# Patient Record
Sex: Male | Born: 1975 | Race: Black or African American | Hispanic: No | Marital: Single | Smoking: Current every day smoker
Health system: Southern US, Community
[De-identification: ages and names within clinical notes are randomized; demographics above are authoritative.]

---

## 2014-09-17 ENCOUNTER — Emergency Department (INDEPENDENT_AMBULATORY_CARE_PROVIDER_SITE_OTHER)
Admission: EM | Admit: 2014-09-17 | Discharge: 2014-09-17 | Disposition: A | Discharge status: Home / Self Care | Payer: BLUE CROSS/BLUE SHIELD | Source: Home / Self Care | Attending: Family Medicine | Admitting: Family Medicine

## 2014-09-17 ENCOUNTER — Encounter (HOSPITAL_COMMUNITY): Payer: Self-pay | Admitting: Emergency Medicine

## 2014-09-17 DIAGNOSIS — T148XXA Other injury of unspecified body region, initial encounter: Secondary | ICD-10-CM

## 2014-09-17 DIAGNOSIS — S39012A Strain of muscle, fascia and tendon of lower back, initial encounter: Secondary | ICD-10-CM

## 2014-09-17 DIAGNOSIS — T148 Other injury of unspecified body region: Secondary | ICD-10-CM | POA: Diagnosis not present

## 2014-09-17 MED ORDER — NAPROXEN 375 MG PO TABS: 375.0000 mg | ORAL_TABLET | Freq: Two times a day (BID) | ORAL | Status: AC

## 2014-09-17 MED ORDER — DICLOFENAC SODIUM 1 % TD GEL
1.0000 | Freq: Four times a day (QID) | TRANSDERMAL | Status: AC
Start: 2014-09-17 — End: ?

## 2014-09-17 NOTE — ED Provider Notes (Signed)
CSN: [id number]     Arrival date & time 09/17/14  1925 History   First MD Initiated Contact with Patient 09/17/14 2041     Chief Complaint  Patient presents with  . Back Pain   (Consider location/radiation/quality/duration/timing/severity/associated sxs/prior Treatment) HPI Comments: 39 year old man was lifting a mattress yesterday evening and experienced sharp pain in the right low back. Pain is gotten worse over the past several hours. It is worse with bending, and getting up and down from a chair and other movements. Pain does not radiate across to the left. Denies spinal pain. Denies focal paresthesias or weakness.   History reviewed. No pertinent past medical history. History reviewed. No pertinent past surgical history. No family history on file. History  Substance Use Topics  . Smoking status: Current Every Day Smoker -- 0.50 packs/day    Types: Cigarettes  . Smokeless tobacco: Not on file  . Alcohol Use: No    Review of Systems  Constitutional: Negative.   Respiratory: Negative.   Gastrointestinal: Negative.   Musculoskeletal: Positive for back pain.  Neurological: Negative.  Negative for numbness.  Psychiatric/Behavioral: Negative.   All other systems reviewed and are negative.   Allergies  Review of patient's allergies indicates no known allergies.  Home Medications   Prior to Admission medications   Medication Sig Start Date End Date Taking? Authorizing Provider  diclofenac sodium (VOLTAREN) 1 % GEL Apply 1 application topically 4 (four) times daily. 09/17/14   Hayden Rasmussenavid Gavyn Zoss, NP  naproxen (NAPROSYN) 375 MG tablet Take 1 tablet (375 mg total) by mouth 2 (two) times daily. 09/17/14   Hayden Rasmussenavid Leeandre Nordling, NP   BP 124/85 mmHg  Pulse 67  Temp(Src) 98.1 F (36.7 C) (Oral)  Resp 18  SpO2 97% Physical Exam  Constitutional: He is oriented to person, place, and time. He appears well-developed and well-nourished.  HENT:  Head: Normocephalic and atraumatic.  Eyes: EOM are normal.  Left eye exhibits no discharge.  Neck: Normal range of motion. Neck supple.  Pulmonary/Chest: Effort normal. No respiratory distress.  Musculoskeletal:  Tenderness to the right low back. Lumbar musculature. No spinal tenderness or deformity. Distal neurovascular motor Sentry is intact.  Neurological: He is alert and oriented to person, place, and time. No cranial nerve deficit.  Skin: Skin is warm and dry.  Psychiatric: He has a normal mood and affect.  Nursing note and vitals reviewed.   ED Course  Procedures (including critical care time) Labs Review Labs Reviewed - No data to display  Imaging Review No results found.   MDM   1. Lumbar strain, initial encounter   2. Muscle strain    Diclofenac gel 4 times a day, HEENT pads or thermic care wrap frequently Naprosyn twice a day when necessary No heavy lifting bending or twisting. Perform stretches gradually as directed.    Hayden Rasmussenavid Gianina Olinde, NP 09/17/14 2053

## 2014-09-17 NOTE — ED Notes (Signed)
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

## 2014-09-17 NOTE — Discharge Instructions (Signed)
Back Pain, Adult Low back pain is very common. About 1 in 5 people have back pain.The cause of low back pain is rarely dangerous. The pain often gets better over time.About half of people with a sudden onset of back pain feel better in just 2 weeks. About 8 in 10 people feel better by 6 weeks.  CAUSES Some common causes of back pain include:  Strain of the muscles or ligaments supporting the spine.  Wear and tear (degeneration) of the spinal discs.  Arthritis.  Direct injury to the back. DIAGNOSIS Most of the time, the direct cause of low back pain is not known.However, back pain can be treated effectively even when the exact cause of the pain is unknown.Answering your caregiver's questions about your overall health and symptoms is one of the most accurate ways to make sure the cause of your pain is not dangerous. If your caregiver needs more information, he or she may order lab work or imaging tests (X-rays or MRIs).However, even if imaging tests show changes in your back, this usually does not require surgery. HOME CARE INSTRUCTIONS For many people, back pain returns.Since low back pain is rarely dangerous, it is often a condition that people can learn to [facility] their own.   Remain active. It is stressful on the back to sit or stand in one place. Do not sit, drive, or stand in one place for more than 30 minutes at a time. Take short walks on level surfaces as soon as pain allows.Try to increase the length of time you walk each day.  Do not stay in bed.Resting more than 1 or 2 days can delay your recovery.  Do not avoid exercise or work.Your body is made to move.It is not dangerous to be active, even though your back may hurt.Your back will likely heal faster if you return to being active before your pain is gone.  Pay attention to your body when you bend and lift. Many people have less discomfortwhen lifting if they bend their knees, keep the load close to their bodies,and  avoid twisting. Often, the most comfortable positions are those that put less stress on your recovering back.  Find a comfortable position to sleep. Use a firm mattress and lie on your side with your knees slightly bent. If you lie on your back, put a pillow under your knees.  Only take over-the-counter or prescription medicines as directed by your caregiver. Over-the-counter medicines to reduce pain and inflammation are often the most helpful.Your caregiver may prescribe muscle relaxant drugs.These medicines help dull your pain so you can more quickly return to your normal activities and healthy exercise.  Put ice on the injured area.  Put ice in a plastic bag.  Place a towel between your skin and the bag.  Leave the ice on for 15-20 minutes, 03-04 times a day for the first 2 to 3 days. After that, ice and heat may be alternated to reduce pain and spasms.  Ask your caregiver about trying back exercises and gentle massage. This may be of some benefit.  Avoid feeling anxious or stressed.Stress increases muscle tension and can worsen back pain.It is important to recognize when you are anxious or stressed and learn ways to manage it.Exercise is a great option. SEEK MEDICAL CARE IF:  You have pain that is not relieved with rest or medicine.  You have pain that does not improve in 1 week.  You have new symptoms.  You are generally not feeling well. SEEK  IMMEDIATE MEDICAL CARE IF:   You have pain that radiates from your back into your legs.  You develop new bowel or bladder control problems.  You have unusual weakness or numbness in your arms or legs.  You develop nausea or vomiting.  You develop abdominal pain.  You feel faint. Document Released: 05/07/2005 Document Revised: 11/06/2011 Document Reviewed: 09/08/2013 [facility] Patient Information 2015 [place], [place]. This information is not intended to replace advice given to you by your health care provider. Make sure you  discuss any questions you have with your health care provider.  Low Back Strain with Rehab A strain is an injury in which a tendon or muscle is torn. The muscles and tendons of the lower back are vulnerable to strains. However, these muscles and tendons are very strong and require a great force to be injured. Strains are classified into three categories. Grade 1 strains cause pain, but the tendon is not lengthened. Grade 2 strains include a lengthened ligament, due to the ligament being stretched or partially ruptured. With grade 2 strains there is still function, although the function may be decreased. Grade 3 strains involve a complete tear of the tendon or muscle, and function is usually impaired. SYMPTOMS   Pain in the lower back.  Pain that affects one side more than the other.  Pain that gets worse with movement and may be felt in the hip, buttocks, or back of the thigh.  Muscle spasms of the muscles in the back.  Swelling along the muscles of the back.  Loss of strength of the back muscles.  Crackling sound (crepitation) when the muscles are touched. CAUSES  Lower back strains occur when a force is placed on the muscles or tendons that is greater than they can handle. Common causes of injury include:  Prolonged overuse of the muscle-tendon units in the lower back, usually from incorrect posture.  A single violent injury or force applied to the back. RISK INCREASES WITH:  Sports that involve twisting forces on the spine or a lot of bending at the waist (football, rugby, weightlifting, bowling, golf, tennis, speed skating, racquetball, swimming, running, gymnastics, diving).  Poor strength and flexibility.  Failure to warm up properly before activity.  Family history of lower back pain or disk disorders.  Previous back injury or surgery (especially fusion).  Poor posture with lifting, especially heavy objects.  Prolonged sitting, especially with poor posture. PREVENTION     Learn and use proper posture when sitting or lifting (maintain proper posture when sitting, lift using the knees and legs, not at the waist).  Warm up and stretch properly before activity.  Allow for adequate recovery between workouts.  Maintain physical fitness:  Strength, flexibility, and endurance.  Cardiovascular fitness. PROGNOSIS  If treated properly, lower back strains usually heal within 6 weeks. RELATED COMPLICATIONS   Recurring symptoms, resulting in a chronic problem.  Chronic inflammation, scarring, and partial muscle-tendon tear.  Delayed healing or resolution of symptoms.  Prolonged disability. TREATMENT  Treatment first involves the use of ice and medicine, to reduce pain and inflammation. The use of strengthening and stretching exercises may help reduce pain with activity. These exercises may be performed at home or with a therapist. Severe injuries may require referral to a therapist for further evaluation and treatment, such as ultrasound. Your caregiver may advise that you wear a back brace or corset, to help reduce pain and discomfort. Often, prolonged bed rest results in greater harm then benefit. Corticosteroid injections may be  recommended. However, these should be reserved for the most serious cases. It is important to avoid using your back when lifting objects. At night, sleep on your back on a firm mattress with a pillow placed under your knees. If non-surgical treatment is unsuccessful, surgery may be needed.  MEDICATION   If pain medicine is needed, nonsteroidal anti-inflammatory medicines (aspirin and ibuprofen), or other minor pain relievers (acetaminophen), are often advised.  Do not take pain medicine for 7 days before surgery.  Prescription pain relievers may be given, if your caregiver thinks they are needed. Use only as directed and only as much as you need.  Ointments applied to the skin may be helpful.  Corticosteroid injections may be given  by your caregiver. These injections should be reserved for the most serious cases, because they may only be given a certain number of times. HEAT AND COLD  Cold treatment (icing) should be applied for 10 to 15 minutes every 2 to 3 hours for inflammation and pain, and immediately after activity that aggravates your symptoms. Use ice packs or an ice massage.  Heat treatment may be used before performing stretching and strengthening activities prescribed by your caregiver, physical therapist, or athletic trainer. Use a heat pack or a warm water soak. SEEK MEDICAL CARE IF:   Symptoms get worse or do not improve in 2 to 4 weeks, despite treatment.  You develop numbness, weakness, or loss of bowel or bladder function.  New, unexplained symptoms develop. (Drugs used in treatment may produce side effects.) EXERCISES  RANGE OF MOTION (ROM) AND STRETCHING EXERCISES - Low Back Strain Most people with lower back pain will find that their symptoms get worse with excessive bending forward (flexion) or arching at the lower back (extension). The exercises which will help resolve your symptoms will focus on the opposite motion.  Your physician, physical therapist or athletic trainer will help you determine which exercises will be most helpful to resolve your lower back pain. Do not complete any exercises without first consulting with your caregiver. Discontinue any exercises which make your symptoms worse until you speak to your caregiver.  If you have pain, numbness or tingling which travels down into your buttocks, leg or foot, the goal of the therapy is for these symptoms to move closer to your back and eventually resolve. Sometimes, these leg symptoms will get better, but your lower back pain may worsen. This is typically an indication of progress in your rehabilitation. Be very alert to any changes in your symptoms and the activities in which you participated in the 24 hours prior to the change. Sharing this  information with your caregiver will allow him/her to most efficiently treat your condition.  These exercises may help you when beginning to rehabilitate your injury. Your symptoms may resolve with or without further involvement from your physician, physical therapist or athletic trainer. While completing these exercises, remember:  Restoring tissue flexibility helps normal motion to return to the joints. This allows healthier, less painful movement and activity.  An effective stretch should be held for at least 30 seconds.  A stretch should never be painful. You should only feel a gentle lengthening or release in the stretched tissue. FLEXION RANGE OF MOTION AND STRETCHING EXERCISES: STRETCH - Flexion, Single Knee to Chest   Lie on a firm bed or floor with both legs extended in front of you.  Keeping one leg in contact with the floor, bring your opposite knee to your chest. Hold your leg in place  by either grabbing behind your thigh or at your knee.  Pull until you feel a gentle stretch in your lower back. Hold __________ seconds.  Slowly release your grasp and repeat the exercise with the opposite side. Repeat __________ times. Complete this exercise __________ times per day.  STRETCH - Flexion, Double Knee to Chest   Lie on a firm bed or floor with both legs extended in front of you.  Keeping one leg in contact with the floor, bring your opposite knee to your chest.  Tense your stomach muscles to support your back and then lift your other knee to your chest. Hold your legs in place by either grabbing behind your thighs or at your knees.  Pull both knees toward your chest until you feel a gentle stretch in your lower back. Hold __________ seconds.  Tense your stomach muscles and slowly return one leg at a time to the floor. Repeat __________ times. Complete this exercise __________ times per day.  STRETCH - Low Trunk Rotation  Lie on a firm bed or floor. Keeping your legs in front  of you, bend your knees so they are both pointed toward the ceiling and your feet are flat on the floor.  Extend your arms out to the side. This will stabilize your upper body by keeping your shoulders in contact with the floor.  Gently and slowly drop both knees together to one side until you feel a gentle stretch in your lower back. Hold for __________ seconds.  Tense your stomach muscles to support your lower back as you bring your knees back to the starting position. Repeat the exercise to the other side. Repeat __________ times. Complete this exercise __________ times per day  EXTENSION RANGE OF MOTION AND FLEXIBILITY EXERCISES: STRETCH - Extension, Prone on Elbows   Lie on your stomach on the floor, a bed will be too soft. Place your palms about shoulder width apart and at the height of your head.  Place your elbows under your shoulders. If this is too painful, stack pillows under your chest.  Allow your body to relax so that your hips drop lower and make contact more completely with the floor.  Hold this position for __________ seconds.  Slowly return to lying flat on the floor. Repeat __________ times. Complete this exercise __________ times per day.  RANGE OF MOTION - Extension, Prone Press Ups  Lie on your stomach on the floor, a bed will be too soft. Place your palms about shoulder width apart and at the height of your head.  Keeping your back as relaxed as possible, slowly straighten your elbows while keeping your hips on the floor. You may adjust the placement of your hands to maximize your comfort. As you gain motion, your hands will come more underneath your shoulders.  Hold this position __________ seconds.  Slowly return to lying flat on the floor. Repeat __________ times. Complete this exercise __________ times per day.  RANGE OF MOTION- Quadruped, Neutral Spine   Assume a hands and knees position on a firm surface. Keep your hands under your shoulders and your  knees under your hips. You may place padding under your knees for comfort.  Drop your head and point your tail bone toward the ground below you. This will round out your lower back like an angry cat. Hold this position for __________ seconds.  Slowly lift your head and release your tail bone so that your back sags into a large arch, like an old horse.  Hold this position for __________ seconds.  Repeat this until you feel limber in your lower back.  Now, find your "sweet spot." This will be the most comfortable position somewhere between the two previous positions. This is your neutral spine. Once you have found this position, tense your stomach muscles to support your lower back.  Hold this position for __________ seconds. Repeat __________ times. Complete this exercise __________ times per day.  STRENGTHENING EXERCISES - Low Back Strain These exercises may help you when beginning to rehabilitate your injury. These exercises should be done near your "sweet spot." This is the neutral, low-back arch, somewhere between fully rounded and fully arched, that is your least painful position. When performed in this safe range of motion, these exercises can be used for people who have either a flexion or extension based injury. These exercises may resolve your symptoms with or without further involvement from your physician, physical therapist or athletic trainer. While completing these exercises, remember:   Muscles can gain both the endurance and the strength needed for everyday activities through controlled exercises.  Complete these exercises as instructed by your physician, physical therapist or athletic trainer. Increase the resistance and repetitions only as guided.  You may experience muscle soreness or fatigue, but the pain or discomfort you are trying to eliminate should never worsen during these exercises. If this pain does worsen, stop and make certain you are following the directions  exactly. If the pain is still present after adjustments, discontinue the exercise until you can discuss the trouble with your caregiver. STRENGTHENING - Deep Abdominals, Pelvic Tilt  Lie on a firm bed or floor. Keeping your legs in front of you, bend your knees so they are both pointed toward the ceiling and your feet are flat on the floor.  Tense your lower abdominal muscles to press your lower back into the floor. This motion will rotate your pelvis so that your tail bone is scooping upwards rather than pointing at your feet or into the floor.  With a gentle tension and even breathing, hold this position for __________ seconds. Repeat __________ times. Complete this exercise __________ times per day.  STRENGTHENING - Abdominals, Crunches   Lie on a firm bed or floor. Keeping your legs in front of you, bend your knees so they are both pointed toward the ceiling and your feet are flat on the floor. Cross your arms over your chest.  Slightly tip your chin down without bending your neck.  Tense your abdominals and slowly lift your trunk high enough to just clear your shoulder blades. Lifting higher can put excessive stress on the lower back and does not further strengthen your abdominal muscles.  Control your return to the starting position. Repeat __________ times. Complete this exercise __________ times per day.  STRENGTHENING - Quadruped, Opposite UE/LE Lift   Assume a hands and knees position on a firm surface. Keep your hands under your shoulders and your knees under your hips. You may place padding under your knees for comfort.  Find your neutral spine and gently tense your abdominal muscles so that you can maintain this position. Your shoulders and hips should form a rectangle that is parallel with the floor and is not twisted.  Keeping your trunk steady, lift your right hand no higher than your shoulder and then your left leg no higher than your hip. Make sure you are not holding your  breath. Hold this position __________ seconds.  Continuing to keep your abdominal muscles tense  and your back steady, slowly return to your starting position. Repeat with the opposite arm and leg. Repeat __________ times. Complete this exercise __________ times per day.  STRENGTHENING - Lower Abdominals, Double Knee Lift  Lie on a firm bed or floor. Keeping your legs in front of you, bend your knees so they are both pointed toward the ceiling and your feet are flat on the floor.  Tense your abdominal muscles to brace your lower back and slowly lift both of your knees until they come over your hips. Be certain not to hold your breath.  Hold __________ seconds. Using your abdominal muscles, return to the starting position in a slow and controlled manner. Repeat __________ times. Complete this exercise __________ times per day.  POSTURE AND BODY MECHANICS CONSIDERATIONS - Low Back Strain Keeping correct posture when sitting, standing or completing your activities will reduce the stress put on different body tissues, allowing injured tissues a chance to heal and limiting painful experiences. The following are general guidelines for improved posture. Your physician or physical therapist will provide you with any instructions specific to your needs. While reading these guidelines, remember:  The exercises prescribed by your provider will help you have the flexibility and strength to maintain correct postures.  The correct posture provides the best environment for your joints to work. All of your joints have less wear and tear when properly supported by a spine with good posture. This means you will experience a healthier, less painful body.  Correct posture must be practiced with all of your activities, especially prolonged sitting and standing. Correct posture is as important when doing repetitive low-stress activities (typing) as it is when doing a single heavy-load activity (lifting). RESTING  POSITIONS Consider which positions are most painful for you when choosing a resting position. If you have pain with flexion-based activities (sitting, bending, stooping, squatting), choose a position that allows you to rest in a less flexed posture. You would want to avoid curling into a fetal position on your side. If your pain worsens with extension-based activities (prolonged standing, working overhead), avoid resting in an extended position such as sleeping on your stomach. Most people will find more comfort when they rest with their spine in a more neutral position, neither too rounded nor too arched. Lying on a non-sagging bed on your side with a pillow between your knees, or on your back with a pillow under your knees will often provide some relief. Keep in mind, being in any one position for a prolonged period of time, no matter how correct your posture, can still lead to stiffness. PROPER SITTING POSTURE In order to minimize stress and discomfort on your spine, you must sit with correct posture. Sitting with good posture should be effortless for a healthy body. Returning to good posture is a gradual process. Many people can work toward this most comfortably by using various supports until they have the flexibility and strength to maintain this posture on their own. When sitting with proper posture, your ears will fall over your shoulders and your shoulders will fall over your hips. You should use the back of the chair to support your upper back. Your lower back will be in a neutral position, just slightly arched. You may place a small pillow or folded towel at the base of your lower back for support.  When working at a desk, create an environment that supports good, upright posture. Without extra support, muscles tire, which leads to excessive strain on joints and  other tissues. Keep these recommendations in mind: CHAIR:  A chair should be able to slide under your desk when your back makes contact  with the back of the chair. This allows you to work closely.  The chair's height should allow your eyes to be level with the upper part of your monitor and your hands to be slightly lower than your elbows. BODY POSITION  Your feet should make contact with the floor. If this is not possible, use a foot rest.  Keep your ears over your shoulders. This will reduce stress on your neck and lower back. INCORRECT SITTING POSTURES  If you are feeling tired and unable to assume a healthy sitting posture, do not slouch or slump. This puts excessive strain on your back tissues, causing more damage and pain. Healthier options include:  Using more support, like a lumbar pillow.  Switching tasks to something that requires you to be upright or walking.  Talking a brief walk.  Lying down to rest in a neutral-spine position. PROLONGED STANDING WHILE SLIGHTLY LEANING FORWARD  When completing a task that requires you to lean forward while standing in one place for a long time, place either foot up on a stationary 2-4 inch high object to help maintain the best posture. When both feet are on the ground, the lower back tends to lose its slight inward curve. If this curve flattens (or becomes too large), then the back and your other joints will experience too much stress, tire more quickly, and can cause pain. CORRECT STANDING POSTURES Proper standing posture should be assumed with all daily activities, even if they only take a few moments, like when brushing your teeth. As in sitting, your ears should fall over your shoulders and your shoulders should fall over your hips. You should keep a slight tension in your abdominal muscles to brace your spine. Your tailbone should point down to the ground, not behind your body, resulting in an over-extended swayback posture.  INCORRECT STANDING POSTURES  Common incorrect standing postures include a forward head, locked knees and/or an excessive swayback. WALKING Walk with  an upright posture. Your ears, shoulders and hips should all line-up. PROLONGED ACTIVITY IN A FLEXED POSITION When completing a task that requires you to bend forward at your waist or lean over a low surface, try to find a way to stabilize 3 out of 4 of your limbs. You can place a hand or elbow on your thigh or rest a knee on the surface you are reaching across. This will provide you more stability so that your muscles do not fatigue as quickly. By keeping your knees relaxed, or slightly bent, you will also reduce stress across your lower back. CORRECT LIFTING TECHNIQUES DO :   Assume a wide stance. This will provide you more stability and the opportunity to get as close as possible to the object which you are lifting.  Tense your abdominals to brace your spine. Bend at the knees and hips. Keeping your back locked in a neutral-spine position, lift using your leg muscles. Lift with your legs, keeping your back straight.  Test the weight of unknown objects before attempting to lift them.  Try to keep your elbows locked down at your sides in order get the best strength from your shoulders when carrying an object.  Always ask for help when lifting heavy or awkward objects. INCORRECT LIFTING TECHNIQUES DO NOT:   Lock your knees when lifting, even if it is a small object.  [employer]  and twist. Pivot at your feet or move your feet when needing to change directions.  Assume that you can safely pick up even a paper clip without proper posture. Document Released: 05/07/2005 Document Revised: 07/30/2011 Document Reviewed: 08/19/2008 [facility] Patient Information 2015 [place], [place]. This information is not intended to replace advice given to you by your health care provider. Make sure you discuss any questions you have with your health care provider.

## 2014-10-28 ENCOUNTER — Emergency Department (HOSPITAL_COMMUNITY)
Admission: EM | Admit: 2014-10-28 | Discharge: 2014-10-28 | Disposition: A | Discharge status: Home / Self Care | Payer: Self-pay | Attending: Emergency Medicine | Admitting: Emergency Medicine

## 2014-10-28 ENCOUNTER — Encounter (HOSPITAL_COMMUNITY): Payer: Self-pay | Admitting: Emergency Medicine

## 2014-10-28 ENCOUNTER — Emergency Department (HOSPITAL_COMMUNITY): Payer: BLUE CROSS/BLUE SHIELD

## 2014-10-28 DIAGNOSIS — Y999 Unspecified external cause status: Secondary | ICD-10-CM | POA: Insufficient documentation

## 2014-10-28 DIAGNOSIS — Y92410 Unspecified street and highway as the place of occurrence of the external cause: Secondary | ICD-10-CM | POA: Insufficient documentation

## 2014-10-28 DIAGNOSIS — M62830 Muscle spasm of back: Secondary | ICD-10-CM | POA: Insufficient documentation

## 2014-10-28 DIAGNOSIS — Z720 Tobacco use: Secondary | ICD-10-CM | POA: Insufficient documentation

## 2014-10-28 DIAGNOSIS — Z791 Long term (current) use of non-steroidal anti-inflammatories (NSAID): Secondary | ICD-10-CM | POA: Insufficient documentation

## 2014-10-28 DIAGNOSIS — Y9389 Activity, other specified: Secondary | ICD-10-CM | POA: Insufficient documentation

## 2014-10-28 MED ORDER — CYCLOBENZAPRINE HCL 10 MG PO TABS: 10.0000 mg | ORAL_TABLET | Freq: Two times a day (BID) | ORAL | Status: DC | PRN

## 2014-10-28 MED ORDER — DICLOFENAC SODIUM 50 MG PO TBEC: 50.0000 mg | DELAYED_RELEASE_TABLET | Freq: Two times a day (BID) | ORAL | Status: AC

## 2014-10-28 MED ORDER — CYCLOBENZAPRINE HCL 10 MG PO TABS
5.0000 mg | ORAL_TABLET | Freq: Once | ORAL | Status: AC
  Administered 2014-10-28: 5 mg via ORAL
  Filled 2014-10-28: qty 1
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
## 2014-10-28 NOTE — Discharge Instructions (Signed)
Do not take th muscle relaxant if driving as it will make you sleepy.

## 2015-05-10 ENCOUNTER — Emergency Department (HOSPITAL_COMMUNITY): Payer: BLUE CROSS/BLUE SHIELD

## 2015-05-10 ENCOUNTER — Emergency Department (HOSPITAL_COMMUNITY)
Admission: EM | Admit: 2015-05-10 | Discharge: 2015-05-10 | Disposition: A | Discharge status: Home / Self Care | Payer: BLUE CROSS/BLUE SHIELD | Attending: Emergency Medicine | Admitting: Emergency Medicine

## 2015-05-10 ENCOUNTER — Encounter (HOSPITAL_COMMUNITY): Payer: Self-pay | Admitting: Adult Health

## 2015-05-10 DIAGNOSIS — F17210 Nicotine dependence, cigarettes, uncomplicated: Secondary | ICD-10-CM | POA: Insufficient documentation

## 2015-05-10 DIAGNOSIS — M7041 Prepatellar bursitis, right knee: Secondary | ICD-10-CM | POA: Insufficient documentation

## 2015-05-10 DIAGNOSIS — Y990 Civilian activity done for income or pay: Secondary | ICD-10-CM | POA: Insufficient documentation

## 2015-05-10 DIAGNOSIS — Z791 Long term (current) use of non-steroidal anti-inflammatories (NSAID): Secondary | ICD-10-CM | POA: Insufficient documentation

## 2015-05-10 DIAGNOSIS — M25561 Pain in right knee: Secondary | ICD-10-CM

## 2015-05-10 DIAGNOSIS — M7051 Other bursitis of knee, right knee: Secondary | ICD-10-CM

## 2015-05-10 DIAGNOSIS — S8001XA Contusion of right knee, initial encounter: Secondary | ICD-10-CM | POA: Insufficient documentation

## 2015-05-10 DIAGNOSIS — Y9389 Activity, other specified: Secondary | ICD-10-CM | POA: Insufficient documentation

## 2015-05-10 DIAGNOSIS — W228XXA Striking against or struck by other objects, initial encounter: Secondary | ICD-10-CM | POA: Insufficient documentation

## 2015-05-10 DIAGNOSIS — Y9289 Other specified places as the place of occurrence of the external cause: Secondary | ICD-10-CM | POA: Insufficient documentation

## 2015-05-10 MED ORDER — HYDROCODONE-ACETAMINOPHEN 5-325 MG PO TABS: 1.0000 | ORAL_TABLET | Freq: Four times a day (QID) | ORAL | Status: DC | PRN

## 2015-05-10 MED ORDER — NAPROXEN 500 MG PO TABS: 500.0000 mg | ORAL_TABLET | Freq: Two times a day (BID) | ORAL | Status: AC

## 2015-05-10 NOTE — Discharge Instructions (Signed)
Wear knee sleeve and use crutches as needed for comfort. Ice and elevate knee throughout the day. Alternate between naprosyn and norco for pain relief. Do not drive or operate machinery with pain medication use. Call orthopedic follow up today or tomorrow to schedule followup appointment for recheck of ongoing knee pain in one to two weeks that can be canceled with a 24-48 hour notice if complete resolution of pain. Return to the ER for changes or worsening symptoms.   Knee Pain Knee pain is a very common symptom and can have many causes. Knee pain often goes away when you follow your health care provider's instructions for relieving pain and discomfort at home. However, knee pain can develop into a condition that needs treatment. Some conditions may include:  Arthritis caused by wear and tear (osteoarthritis).  Arthritis caused by swelling and irritation (rheumatoid arthritis or gout).  A cyst or growth in your knee.  An infection in your knee joint.  An injury that will not heal.  Damage, swelling, or irritation of the tissues that support your knee (torn ligaments or tendinitis). If your knee pain continues, additional tests may be ordered to diagnose your condition. Tests may include X-rays or other imaging studies of your knee. You may also need to have fluid removed from your knee. Treatment for ongoing knee pain depends on the cause, but treatment may include:  Medicines to relieve pain or swelling.  Steroid injections in your knee.  Physical therapy.  Surgery. HOME CARE INSTRUCTIONS  Take medicines only as directed by your health care provider.  Rest your knee and keep it raised (elevated) while you are resting.  Do not do things that cause or worsen pain.  Avoid high-impact activities or exercises, such as running, jumping rope, or doing jumping jacks.  Apply ice to the knee area:  Put ice in a plastic bag.  Place a towel between your skin and the bag.  Leave the  ice on for 20 minutes, 2-3 times a day.  Ask your health care provider if you should wear an elastic knee support.  Keep a pillow under your knee when you sleep.  Lose weight if you are overweight. Extra weight can put pressure on your knee.  Do not use any tobacco products, including cigarettes, chewing tobacco, or electronic cigarettes. If you need help quitting, ask your health care provider. Smoking may slow the healing of any bone and joint problems that you may have. SEEK MEDICAL CARE IF:  Your knee pain continues, changes, or gets worse.  You have a fever along with knee pain.  Your knee buckles or locks up.  Your knee becomes more swollen. SEEK IMMEDIATE MEDICAL CARE IF:   Your knee joint feels hot to the touch.  You have chest pain or trouble breathing.   This information is not intended to replace advice given to you by your health care provider. Make sure you discuss any questions you have with your health care provider.   Document Released: 03/04/2007 Document Revised: 05/28/2014 Document Reviewed: 12/21/2013 Elsevier Interactive Patient Education 2016 [employer]  Bursitis Bursitis is when the fluid-filled sac (bursa) that covers and protects a joint is swollen (inflamed). Bursitis is most common near joints, especially the knees, elbows, hips, and shoulders.  HOME CARE  Take medicines only as told by your doctor.  If you were prescribed an antibiotic medicine, finish it all even if you start to feel better.  Rest the affected area as told by your doctor.  Keep the area raised up.  Avoid doing things that make the pain worse.  Apply ice to the injured area:  Place ice in a plastic bag.  Place a towel between your skin and the bag.  Leave the ice on for 20 minutes, 2-3 times a day.  Use splints, braces, pads, or walking aids as told by your doctor.  Keep all follow-up visits as told by your doctor. This is important. GET HELP IF:   You have more  pain with home care.  You have a fever.  You have chills.   This information is not intended to replace advice given to you by your health care provider. Make sure you discuss any questions you have with your health care provider.   Document Released: 10/25/2009 Document Revised: 05/28/2014 Document Reviewed: 07/27/2013 Elsevier Interactive Patient Education 2016 [employer].  Contusion A contusion is a deep bruise. Contusions happen when an injury causes bleeding under the skin. Symptoms of bruising include pain, swelling, and discolored skin. The skin may turn blue, purple, or yellow. HOME CARE   Rest the injured area.  If told, put ice on the injured area.  Put ice in a plastic bag.  Place a towel between your skin and the bag.  Leave the ice on for 20 minutes, 2-3 times per day.  If told, put light pressure (compression) on the injured area using an elastic bandage. Make sure the bandage is not too tight. Remove it and put it back on as told by your doctor.  If possible, raise (elevate) the injured area above the level of your heart while you are sitting or lying down.  Take over-the-counter and prescription medicines only as told by your doctor. GET HELP IF:  Your symptoms do not get better after several days of treatment.  Your symptoms get worse.  You have trouble moving the injured area. GET HELP RIGHT AWAY IF:   You have very bad pain.  You have a loss of feeling (numbness) in a hand or foot.  Your hand or foot turns pale or cold.   This information is not intended to replace advice given to you by your health care provider. Make sure you discuss any questions you have with your health care provider.   Document Released: 10/24/2007 Document Revised: 01/26/2015 Document Reviewed: 09/22/2014 Elsevier Interactive Patient Education 2016 Else[employer] Inc.  Cryotherapy Cryotherapy is when you put ice on your injury. Ice helps lessen pain and puffiness (swelling)  after an injury. Ice works the best when you start using it in the first 24 to 48 hours after an injury. HOME CARE  Put a dry or damp towel between the ice pack and your skin.  You may press gently on the ice pack.  Leave the ice on for no more than 10 to 20 minutes at a time.  Check your skin after 5 minutes to make sure your skin is okay.  Rest at least 20 minutes between ice pack uses.  Stop using ice when your skin loses feeling (numbness).  Do not use ice on someone who cannot tell you when it hurts. This includes small children and people with memory problems (dementia). GET HELP RIGHT AWAY IF:  You have white spots on your skin.  Your skin turns blue or pale.  Your skin feels waxy or hard.  Your puffiness gets worse. MAKE SURE YOU:   Understand these instructions.  Will watch your condition.  Will get help right away if you  are not doing well or get worse.   This information is not intended to replace advice given to you by your health care provider. Make sure you discuss any questions you have with your health care provider.   Document Released: 10/24/2007 Document Revised: 07/30/2011 Document Reviewed: 12/28/2010 Elsevier Interactive Patient Education 2016 Elsevier Inc.  [employer]ockerHeat Therapy Heat therapy can help ease sore, stiff, injured, and tight muscles and joints. Heat relaxes your muscles, which may help ease your pain.  RISKS AND COMPLICATIONS If you have any of the following conditions, do not use heat therapy unless your health care provider has approved:  Poor circulation.  Healing wounds or scarred skin in the area being treated.  Diabetes, heart disease, or high blood pressure.  Not being able to feel (numbness) the area being treated.  Unusual swelling of the area being treated.  Active infections.  Blood clots.  Cancer.  Inability to communicate pain. This may include young children and people who have problems with their brain function  (dementia).  Pregnancy. Heat therapy should only be used on old, pre-existing, or long-lasting (chronic) injuries. Do not use heat therapy on new injuries unless directed by your health care provider. HOW TO USE HEAT THERAPY There are several different kinds of heat therapy, including:  Moist heat pack.  Warm water bath.  Hot water bottle.  Electric heating pad.  Heated gel pack.  Heated wrap.  Electric heating pad. Use the heat therapy method suggested by your health care provider. Follow your health care provider's instructions on when and how to use heat therapy. GENERAL HEAT THERAPY RECOMMENDATIONS  Do not sleep while using heat therapy. Only use heat therapy while you are awake.  Your skin may turn pink while using heat therapy. Do not use heat therapy if your skin turns red.  Do not use heat therapy if you have new pain.  High heat or long exposure to heat can cause burns. Be careful when using heat therapy to avoid burning your skin.  Do not use heat therapy on areas of your skin that are already irritated, such as with a rash or sunburn. SEEK MEDICAL CARE IF:  You have blisters, redness, swelling, or numbness.  You have new pain.  Your pain is worse. MAKE SURE YOU:  Understand these instructions.  Will watch your condition.  Will get help right away if you are not doing well or get worse.   This information is not intended to replace advice given to you by your health care provider. Make sure you discuss any questions you have with your health care provider.   Document Released: 07/30/2011 Document Revised: 05/28/2014 Document Reviewed: 06/30/2013 Elsevier Interactive Patient Education [employer]

## 2015-05-10 NOTE — ED Provider Notes (Signed)
CSN: [id number]     Arrival date & time 05/10/15  1831 History  By signing my name below, I, Octavia Heir, attest that this documentation has been prepared under the direction and in the presence of Camry Robello Camprubi-Soms, PA-C. Electronically Signed: Octavia Heir, ED Scribe. 05/10/2015. 7:18 PM.     Chief Complaint  Patient presents with  . Knee Pain      Patient is a 39 y.o. male presenting with knee pain. The history is provided by the patient. No language interpreter was used.  Knee Pain Location:  Knee Time since incident:  7 hours Injury: yes   Mechanism of injury comment:  Hit on side of work truck Knee location:  R knee Pain details:    Quality:  Sharp   Radiates to:  Does not radiate   Severity:  Moderate   Onset quality:  Sudden   Duration:  6 hours   Timing:  Intermittent   Progression:  Unchanged Chronicity:  New Dislocation: no   Foreign body present:  No foreign bodies Prior injury to area:  No Relieved by:  Nothing Exacerbated by: touch. Ineffective treatments:  Ice Associated symptoms: swelling   Associated symptoms: no back pain, no decreased ROM, no fever, no muscle weakness, no neck pain, no numbness and no tingling    HPI Comments: Edgar Cooke is a 39 y.o. male who presents to the Emergency Department complaining of a intermittent, 6/10, sharp nonradiating right knee pain with associated swelling onset about 7 hours ago. He notes he was at work today when he hit his right knee on the side of a truck. Pt endorses increased pain when laying down or when something touches his knee. He states he has not taken any medication to alleviate the pain but applied ice to the area with minimal relief. Denies injury to his right knee in the past. Also denies fevers, chills, CP, SOB, abd pain, N/V/D/C, hematuria, dysuria, myalgias, numbness, tingling, weakness, or skin changes. No wounds or bruising, no erythema or warmth.   No past medical history on file. No  past surgical history on file. No family history on file. Social History  Substance Use Topics  . Smoking status: Current Every Day Smoker -- 0.50 packs/day    Types: Cigarettes  . Smokeless tobacco: Not on file  . Alcohol Use: No    Review of Systems  Constitutional: Negative for fever and chills.  Respiratory: Negative for shortness of breath.   Cardiovascular: Negative for chest pain.  Gastrointestinal: Negative for nausea, vomiting, abdominal pain, diarrhea and constipation.  Genitourinary: Negative for dysuria and hematuria.  Musculoskeletal: Positive for joint swelling and arthralgias. Negative for myalgias, back pain and neck pain.  Skin: Negative for color change and wound.  Allergic/Immunologic: Negative for immunocompromised state.  Neurological: Negative for weakness and numbness.   10 Systems reviewed and are negative for acute change except as noted in the HPI.    Allergies  Review of patient's allergies indicates no known allergies.  Home Medications   Prior to Admission medications   Medication Sig Start Date End Date Taking? Authorizing Provider  cyclobenzaprine (FLEXERIL) 10 MG tablet Take 1 tablet (10 mg total) by mouth 2 (two) times daily as needed for muscle spasms. 10/28/14   Hope Orlene Och, NP  diclofenac (VOLTAREN) 50 MG EC tablet Take 1 tablet (50 mg total) by mouth 2 (two) times daily. 10/28/14   Hope Orlene Och, NP  diclofenac sodium (VOLTAREN) 1 % GEL Apply 1 application  topically 4 (four) times daily. 09/17/14   Hayden Rasmussen, NP  naproxen (NAPROSYN) 375 MG tablet Take 1 tablet (375 mg total) by mouth 2 (two) times daily. 09/17/14   Hayden Rasmussen, NP   Triage vitals: BP 115/75 mmHg  Pulse 73  Temp(Src) 97.9 F (36.6 C) (Oral)  Resp 16  Ht  (1.753 m)  Wt 180 lb (81.647 kg)  BMI 26.57 kg/m2  SpO2 100% Physical Exam  Constitutional: He is oriented to person, place, and time. Vital signs are normal. He appears well-developed and well-nourished.  Non-toxic  appearance. No distress.  Afebrile, nontoxic, NAD  HENT:  Head: Normocephalic and atraumatic.  Mouth/Throat: Mucous membranes are normal.  Eyes: Conjunctivae and EOM are normal. Right eye exhibits no discharge. Left eye exhibits no discharge.  Neck: Normal range of motion. Neck supple.  Cardiovascular: Normal rate and intact distal pulses.   Pulmonary/Chest: Effort normal. No respiratory distress.  Abdominal: Normal appearance. He exhibits no distension.  Musculoskeletal: Normal range of motion.       Right knee: He exhibits swelling. He exhibits normal range of motion, no ecchymosis, no deformity, no laceration, no erythema, normal alignment, no LCL laxity, normal patellar mobility and no MCL laxity. Tenderness (suprapatellar) found.       Legs: Right knee with FROM intact, no joint line or bony TTP, with mild TTP over the patella with overlying swelling to the suprapatellar bursa, no effusion/deformity, no bruising or erythema, no warmth, no abnormal alignment or patellar mobility, no varus/valgus laxity, neg anterior drawer test, no crepitus. Strength and sensation grossly intact, distal pulses intact. Skin intact    Neurological: He is alert and oriented to person, place, and time. He has normal strength. No sensory deficit.  Skin: Skin is warm, dry and intact. No rash noted.  Psychiatric: He has a normal mood and affect.  Nursing note and vitals reviewed.   ED Course  Procedures  DIAGNOSTIC STUDIES: Oxygen Saturation is 100% on RA, normal by my interpretation.  COORDINATION OF CARE:  6:58 PM Discussed treatment plan which includes x-ray of right knee with pt at bedside and pt agreed to plan.   Pt denied pain medication.  Labs Review Labs Reviewed - No data to display  Imaging Review Dg Knee Complete 4 Views Right  05/10/2015  CLINICAL DATA:  Right anterior knee pain after hitting patella on metal door of truck. Evaluate for fracture. Initial encounter. EXAM: RIGHT KNEE -  COMPLETE 4+ VIEW COMPARISON:  None. FINDINGS: Subcutaneous edema about the anterior aspect of knee and cranial aspect the patellar tendon. No associated subcutaneous emphysema or radiopaque foreign body. No fracture or dislocation. No definitive patella [place] deformity. No joint effusion. Joint spaces are preserved. No evidence of chondrocalcinosis. IMPRESSION: Soft tissue swelling about the anterior aspect of the knee without associated fracture or radiopaque foreign body. Electronically Signed   By: Simonne Come M.D.   On: 05/10/2015 19:20   I have personally reviewed and evaluated these images and lab results as part of my medical decision-making.   EKG Interpretation None      MDM   Final diagnoses:  Right knee pain  Knee contusion, right, initial encounter  Patellar bursitis of right knee    39 y.o. male here with traumatic R knee pain after striking metal door at work, NVI with soft compartments, tender over suprapatellar bursa with swelling noted in this area, no erythema or warmth, doubt gout or septic joint. Likely traumatic bursitis but could be patellar  fracture. Will obtain imaging. Pt declines pain meds. No skin injury, no need for tetanus. Will reassess after xray.   8:04 PM Xray neg. Likely traumatic bursitis. Will apply knee sleeve and give crutches. Ice/heat use discussed. Will send home with naprosyn/norco for pain. F/up with ortho in 1-2wks for ongoing symptoms. I explained the diagnosis and have given explicit precautions to return to the ER including for any other new or worsening symptoms. The patient understands and accepts the medical plan as it's been dictated and I have answered their questions. Discharge instructions concerning home care and prescriptions have been given. The patient is STABLE and is discharged to home in good condition.   I personally performed the services described in this documentation, which was scribed in my presence. The recorded information has  been reviewed and is accurate.   BP 115/75 mmHg  Pulse 73  Temp(Src) 97.9 F (36.6 C) (Oral)  Resp 16  Ht 5\' 9"  (1.753 m)  Wt 81.647 kg  BMI 26.57 kg/m2  SpO2 100%  Meds ordered this encounter  Medications  . naproxen (NAPROSYN) 500 MG tablet    Sig: Take 1 tablet (500 mg total) by mouth 2 (two) times daily with a meal. x1-2 weeks    Dispense:  20 tablet    Refill:  0    Order Specific Question:  Supervising Provider    Answer:  MILLER, BRIAN [[id number]]  . HYDROcodone-acetaminophen (NORCO) 5-325 MG tablet    Sig: Take 1 tablet by mouth every 6 (six) hours as needed for severe pain.    Dispense:  6 tablet    Refill:  0    Order Specific Question:  Supervising Provider    Answer:  Eber HongMILLER, BRIAN [[id number]]     Marc Leichter Camprubi-Soms, PA-C 05/10/15 2005  Lorre NickAnthony Allen, MD 05/12/15 0003

## 2015-05-10 NOTE — ED Notes (Signed)
Presents with right knee swelling post hitting it on truck metal bar. Pain is worse with touch, better with standing. Cms intact.

## 2016-06-28 ENCOUNTER — Encounter (HOSPITAL_COMMUNITY): Payer: Self-pay

## 2016-06-28 DIAGNOSIS — F17210 Nicotine dependence, cigarettes, uncomplicated: Secondary | ICD-10-CM | POA: Diagnosis not present

## 2016-06-28 DIAGNOSIS — Y939 Activity, unspecified: Secondary | ICD-10-CM | POA: Insufficient documentation

## 2016-06-28 DIAGNOSIS — Z79899 Other long term (current) drug therapy: Secondary | ICD-10-CM | POA: Diagnosis not present

## 2016-06-28 DIAGNOSIS — S161XXA Strain of muscle, fascia and tendon at neck level, initial encounter: Secondary | ICD-10-CM | POA: Diagnosis not present

## 2016-06-28 DIAGNOSIS — R109 Unspecified abdominal pain: Secondary | ICD-10-CM | POA: Insufficient documentation

## 2016-06-28 DIAGNOSIS — Y999 Unspecified external cause status: Secondary | ICD-10-CM | POA: Insufficient documentation

## 2016-06-28 DIAGNOSIS — S199XXA Unspecified injury of neck, initial encounter: Secondary | ICD-10-CM | POA: Diagnosis present

## 2016-06-28 DIAGNOSIS — S301XXA Contusion of abdominal wall, initial encounter: Secondary | ICD-10-CM | POA: Diagnosis not present

## 2016-06-28 DIAGNOSIS — Y92410 Unspecified street and highway as the place of occurrence of the external cause: Secondary | ICD-10-CM | POA: Insufficient documentation

## 2016-06-28 NOTE — ED Notes (Signed)
Patient states he is stepping outside
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
## 2016-06-28 NOTE — ED Notes (Signed)
Warm blankets handed out and delay explanation provided to all patients in lobby.   
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
## 2016-06-29 ENCOUNTER — Emergency Department (HOSPITAL_COMMUNITY): Payer: No Typology Code available for payment source

## 2016-06-29 ENCOUNTER — Emergency Department (HOSPITAL_COMMUNITY)
Admission: EM | Admit: 2016-06-29 | Discharge: 2016-06-29 | Disposition: A | Discharge status: Home / Self Care | Payer: No Typology Code available for payment source | Attending: Emergency Medicine | Admitting: Emergency Medicine

## 2016-06-29 DIAGNOSIS — S301XXA Contusion of abdominal wall, initial encounter: Secondary | ICD-10-CM

## 2016-06-29 DIAGNOSIS — S161XXA Strain of muscle, fascia and tendon at neck level, initial encounter: Secondary | ICD-10-CM

## 2016-06-29 DIAGNOSIS — M7918 Myalgia, other site: Secondary | ICD-10-CM

## 2016-06-29 LAB — COMPREHENSIVE METABOLIC PANEL
ALT: 19 U/L (ref 17–63)
AST: 26 U/L (ref 15–41)
Albumin: 4 g/dL (ref 3.5–5.0)
Alkaline Phosphatase: 55 U/L (ref 38–126)
Anion gap: 8 (ref 5–15)
BILIRUBIN TOTAL: 0.7 mg/dL (ref 0.3–1.2)
BUN: 16 mg/dL (ref 6–20)
CALCIUM: 9.4 mg/dL (ref 8.9–10.3)
CO2: 26 mmol/L (ref 22–32)
Chloride: 105 mmol/L (ref 101–111)
Creatinine, Ser: 1.27 mg/dL — ABNORMAL HIGH (ref 0.61–1.24)
GFR calc Af Amer: >60 mL/min (ref >60)
GFR calc non Af Amer: >60 mL/min (ref >60)
Glucose, Bld: 82 mg/dL (ref 65–99)
Potassium: 3.6 mmol/L (ref 3.5–5.1)
Sodium: 139 mmol/L (ref 135–145)
Total Protein: 7.1 g/dL (ref 6.5–8.1)

## 2016-06-29 LAB — CBC WITH DIFFERENTIAL/PLATELET
BASOS PCT: 0 %
Basophils Absolute: 0 10*3/uL (ref 0.0–0.1)
Eosinophils Absolute: 0.3 10*3/uL (ref 0.0–0.7)
Eosinophils Relative: 3 %
HEMATOCRIT: 40.7 % (ref 39.0–52.0)
Hemoglobin: 14.2 g/dL (ref 13.0–17.0)
Lymphocytes Relative: 28 %
Lymphs Abs: 2.3 10*3/uL (ref 0.7–4.0)
MCH: 30 pg (ref 26.0–34.0)
MCHC: 34.9 g/dL (ref 30.0–36.0)
MCV: 86 fL (ref 78.0–100.0)
MONO ABS: 0.6 10*3/uL (ref 0.1–1.0)
Monocytes Relative: 7 %
NEUTROS ABS: 5.1 10*3/uL (ref 1.7–7.7)
Neutrophils Relative %: 61 %
Platelets: 253 10*3/uL (ref 150–400)
RBC: 4.73 MIL/uL (ref 4.22–5.81)
RDW: 12 % (ref 11.5–15.5)
WBC: 8.3 10*3/uL (ref 4.0–10.5)

## 2016-06-29 MED ORDER — IBUPROFEN 600 MG PO TABS: 600.0000 mg | ORAL_TABLET | Freq: Four times a day (QID) | ORAL | 0 refills | Status: AC | PRN

## 2016-06-29 MED ORDER — HYDROCODONE-ACETAMINOPHEN 5-325 MG PO TABS: 1.0000 | ORAL_TABLET | Freq: Four times a day (QID) | ORAL | 0 refills | Status: AC | PRN

## 2016-06-29 MED ORDER — CYCLOBENZAPRINE HCL 5 MG PO TABS: 10.0000 mg | ORAL_TABLET | Freq: Two times a day (BID) | ORAL | 0 refills | Status: AC | PRN

## 2016-06-29 MED ORDER — OXYCODONE-ACETAMINOPHEN 5-325 MG PO TABS
1.0000 | ORAL_TABLET | Freq: Once | ORAL | Status: AC
  Administered 2016-06-29: 1 via ORAL
  Filled 2016-06-29: qty 1

## 2016-06-29 MED ORDER — IOPAMIDOL (ISOVUE-300) INJECTION 61%
100.0000 mL | Freq: Once | INTRAVENOUS | Status: AC | PRN
  Administered 2016-06-29: 100 mL via INTRAVENOUS
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
## 2016-06-29 NOTE — ED Notes (Signed)
Pt gone to Xray & CT
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
